# Patient Record
Sex: Female | Born: 1935 | Race: White | Hispanic: No | Marital: Married
Health system: Southern US, Community
[De-identification: ages and names within clinical notes are randomized; demographics above are authoritative.]

---

## 1998-02-27 ENCOUNTER — Other Ambulatory Visit: Admission: RE | Admit: 1998-02-27 | Discharge: 1998-02-27 | Payer: Self-pay | Admitting: Orthopedic Surgery

## 1998-03-01 ENCOUNTER — Other Ambulatory Visit: Admission: RE | Admit: 1998-03-01 | Discharge: 1998-03-01 | Payer: Self-pay | Admitting: Orthopedic Surgery

## 1998-03-07 ENCOUNTER — Ambulatory Visit (HOSPITAL_BASED_OUTPATIENT_CLINIC_OR_DEPARTMENT_OTHER): Admission: RE | Admit: 1998-03-07 | Discharge: 1998-03-07 | Payer: Self-pay | Admitting: Orthopedic Surgery

## 1998-04-18 ENCOUNTER — Other Ambulatory Visit: Admission: RE | Admit: 1998-04-18 | Discharge: 1998-04-18 | Payer: Self-pay | Admitting: Orthopedic Surgery

## 1998-04-20 ENCOUNTER — Inpatient Hospital Stay (HOSPITAL_COMMUNITY): Admission: RE | Admit: 1998-04-20 | Discharge: 1998-04-23 | Payer: Self-pay | Admitting: Orthopedic Surgery

## 1999-09-18 ENCOUNTER — Ambulatory Visit (HOSPITAL_COMMUNITY): Admission: RE | Admit: 1999-09-18 | Discharge: 1999-09-18 | Payer: Self-pay | Admitting: Orthopedic Surgery

## 2000-01-30 ENCOUNTER — Encounter: Admission: RE | Admit: 2000-01-30 | Discharge: 2000-01-30 | Payer: Self-pay | Admitting: Internal Medicine

## 2001-04-28 ENCOUNTER — Inpatient Hospital Stay (HOSPITAL_COMMUNITY): Admission: AD | Admit: 2001-04-28 | Discharge: 2001-05-19 | Payer: Self-pay | Admitting: Orthopaedic Surgery

## 2001-04-28 ENCOUNTER — Encounter: Payer: Self-pay | Admitting: Orthopaedic Surgery

## 2001-05-12 ENCOUNTER — Encounter: Payer: Self-pay | Admitting: Physical Medicine & Rehabilitation

## 2001-05-13 ENCOUNTER — Encounter: Payer: Self-pay | Admitting: Orthopedic Surgery

## 2001-05-14 ENCOUNTER — Encounter: Payer: Self-pay | Admitting: Orthopedic Surgery

## 2002-08-29 ENCOUNTER — Inpatient Hospital Stay (HOSPITAL_COMMUNITY): Admission: AD | Admit: 2002-08-29 | Discharge: 2002-09-16 | Payer: Self-pay | Admitting: Internal Medicine

## 2002-08-31 ENCOUNTER — Encounter: Payer: Self-pay | Admitting: Cardiology

## 2002-09-02 ENCOUNTER — Encounter: Payer: Self-pay | Admitting: Internal Medicine

## 2002-09-15 ENCOUNTER — Encounter: Payer: Self-pay | Admitting: Gastroenterology

## 2003-02-11 ENCOUNTER — Encounter: Payer: Self-pay | Admitting: Orthopedic Surgery

## 2003-02-11 ENCOUNTER — Ambulatory Visit (HOSPITAL_COMMUNITY): Admission: RE | Admit: 2003-02-11 | Discharge: 2003-02-11 | Payer: Self-pay | Admitting: Orthopedic Surgery

## 2003-02-17 ENCOUNTER — Encounter: Payer: Self-pay | Admitting: Orthopedic Surgery

## 2003-02-17 ENCOUNTER — Ambulatory Visit (HOSPITAL_COMMUNITY): Admission: RE | Admit: 2003-02-17 | Discharge: 2003-02-17 | Payer: Self-pay | Admitting: Orthopedic Surgery

## 2003-02-21 ENCOUNTER — Encounter (INDEPENDENT_AMBULATORY_CARE_PROVIDER_SITE_OTHER): Payer: Self-pay | Admitting: Specialist

## 2003-02-21 ENCOUNTER — Ambulatory Visit (HOSPITAL_COMMUNITY): Admission: RE | Admit: 2003-02-21 | Discharge: 2003-02-22 | Payer: Self-pay | Admitting: Orthopedic Surgery

## 2003-02-21 ENCOUNTER — Encounter: Payer: Self-pay | Admitting: Orthopedic Surgery

## 2005-01-06 ENCOUNTER — Ambulatory Visit: Payer: Self-pay | Admitting: Internal Medicine

## 2005-01-06 ENCOUNTER — Encounter: Payer: Self-pay | Admitting: Cardiology

## 2005-01-06 ENCOUNTER — Inpatient Hospital Stay (HOSPITAL_COMMUNITY): Admission: EM | Admit: 2005-01-06 | Discharge: 2005-01-15 | Payer: Self-pay | Admitting: Internal Medicine

## 2005-01-06 ENCOUNTER — Ambulatory Visit: Payer: Self-pay | Admitting: Cardiology

## 2005-01-11 ENCOUNTER — Ambulatory Visit: Payer: Self-pay | Admitting: Pulmonary Disease

## 2005-04-12 ENCOUNTER — Emergency Department (HOSPITAL_COMMUNITY): Admission: EM | Admit: 2005-04-12 | Discharge: 2005-04-12 | Payer: Self-pay | Admitting: Emergency Medicine

## 2005-09-09 ENCOUNTER — Ambulatory Visit: Payer: Self-pay | Admitting: Internal Medicine

## 2005-09-16 ENCOUNTER — Ambulatory Visit: Payer: Self-pay | Admitting: Internal Medicine

## 2005-10-14 ENCOUNTER — Ambulatory Visit: Payer: Self-pay | Admitting: Internal Medicine

## 2005-10-31 ENCOUNTER — Ambulatory Visit: Payer: Self-pay | Admitting: Cardiology

## 2005-11-11 ENCOUNTER — Ambulatory Visit: Payer: Self-pay | Admitting: Pulmonary Disease

## 2005-12-02 ENCOUNTER — Ambulatory Visit: Payer: Self-pay | Admitting: *Deleted

## 2005-12-23 ENCOUNTER — Ambulatory Visit: Payer: Self-pay | Admitting: Internal Medicine

## 2006-02-26 ENCOUNTER — Ambulatory Visit: Payer: Self-pay | Admitting: Cardiology

## 2006-03-05 ENCOUNTER — Emergency Department (HOSPITAL_COMMUNITY): Admission: EM | Admit: 2006-03-05 | Discharge: 2006-03-05 | Payer: Self-pay | Admitting: Emergency Medicine

## 2006-03-19 ENCOUNTER — Inpatient Hospital Stay (HOSPITAL_COMMUNITY): Admission: EM | Admit: 2006-03-19 | Discharge: 2006-03-26 | Payer: Self-pay | Admitting: Emergency Medicine

## 2006-03-19 ENCOUNTER — Ambulatory Visit: Payer: Self-pay | Admitting: Internal Medicine

## 2006-03-19 ENCOUNTER — Encounter: Payer: Self-pay | Admitting: Vascular Surgery

## 2006-04-02 ENCOUNTER — Inpatient Hospital Stay (HOSPITAL_COMMUNITY): Admission: EM | Admit: 2006-04-02 | Discharge: 2006-04-09 | Payer: Self-pay | Admitting: Emergency Medicine

## 2006-09-20 IMAGING — CT CT HEAD W/O CM
1 series · 16 of 30 positions shown, 20 images · IV contrast (agent unspecified)
Comparison: None.

CLINICAL DATA: Bilateral leg generalized weakness.  
 HEAD CT WITHOUT CONTRAST:
TECHNIQUE: Contiguous axial images were obtained from the base of the skull through the vertex, according to standard protocol, without contrast.

[Series 2: head_seq 4.5 h45s st · axial · 0.43mm/px · z∈[-173,-11]mm · 16 of 40 slices shown, 20 images]
[im 2/40  brain]
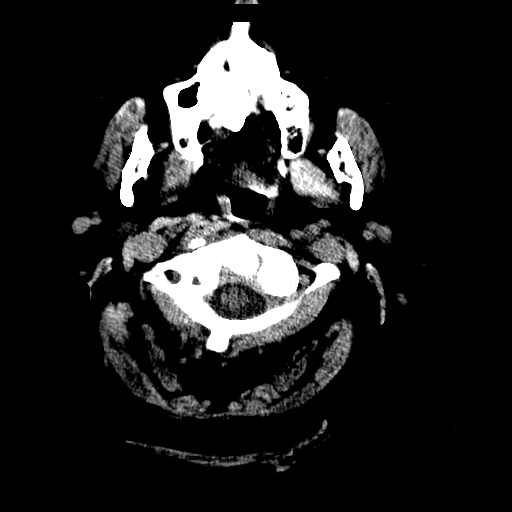
[im 2/40  bone]
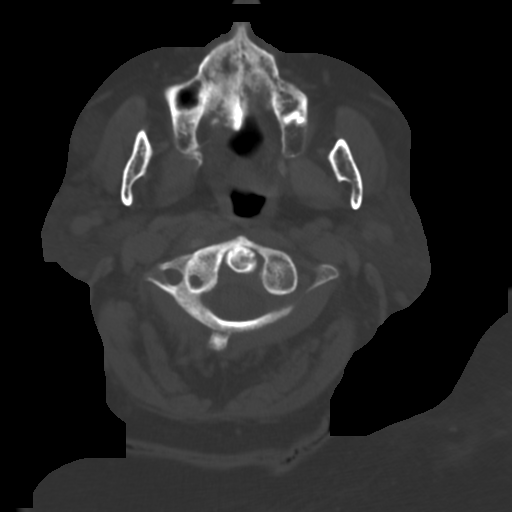
[im 5/40  brain]
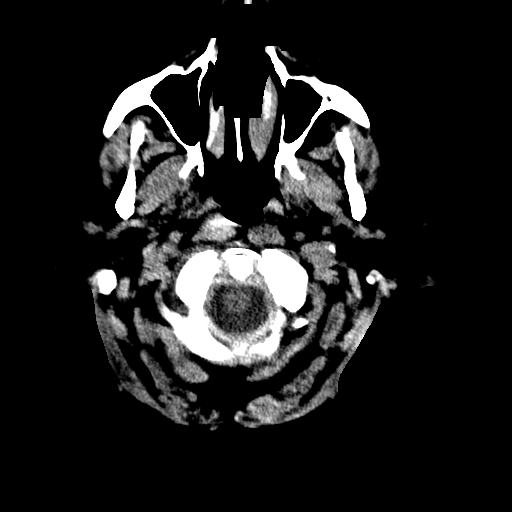
[im 7/40  brain]
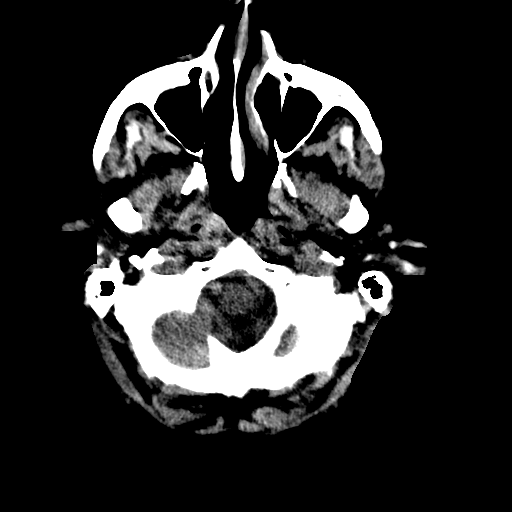
[im 10/40  brain]
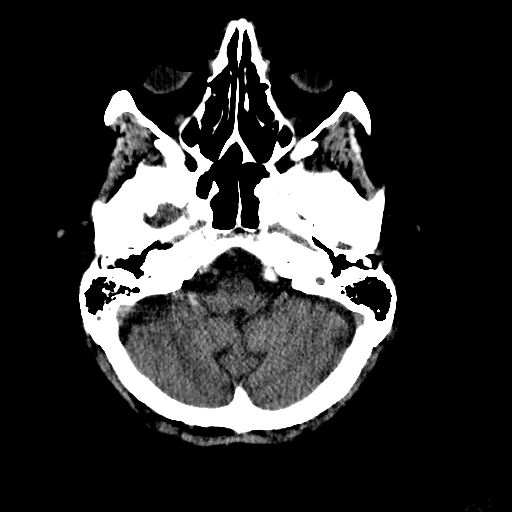
[im 11/40  brain]
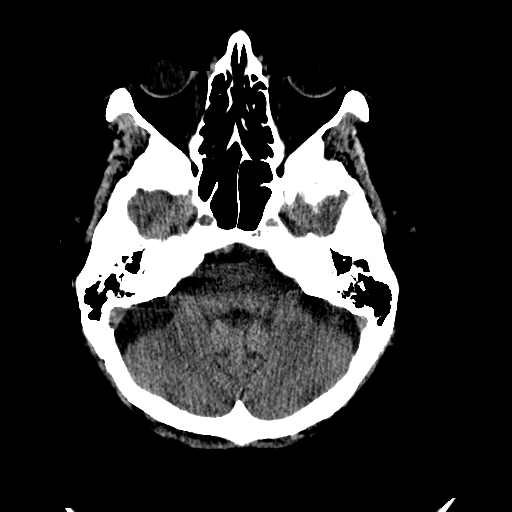
[im 11/40  bone]
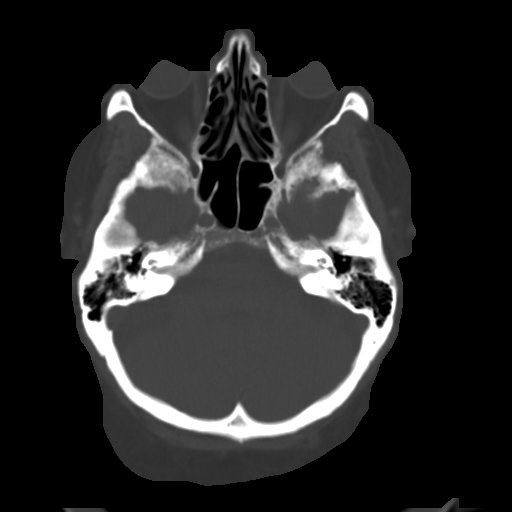
[im 14/40  brain]
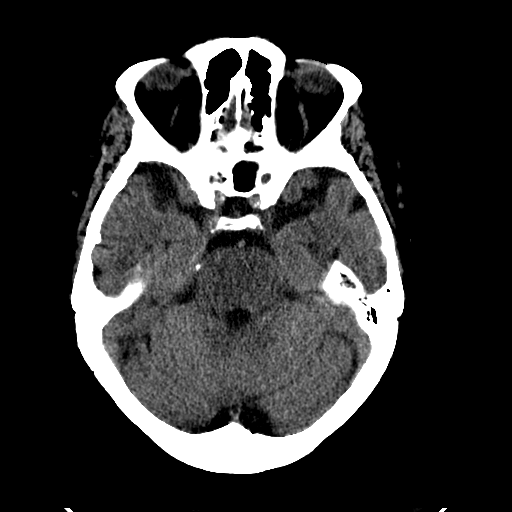
[im 17/40  brain]
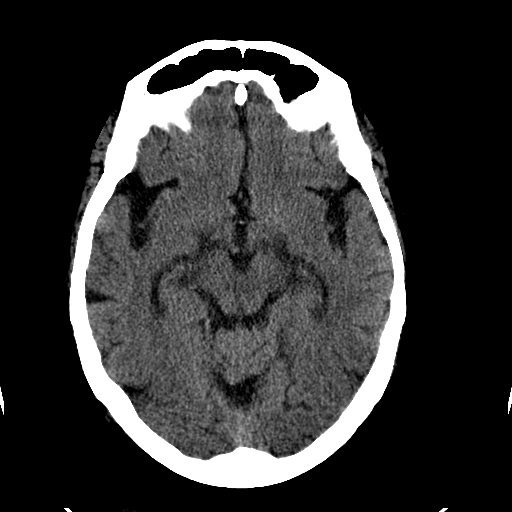
[im 19/40  brain]
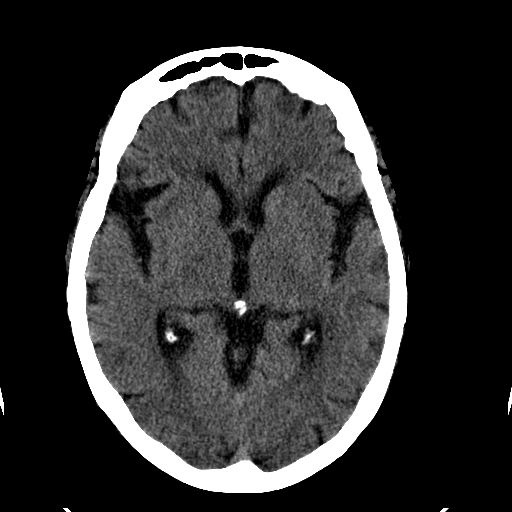
[im 21/40  brain]
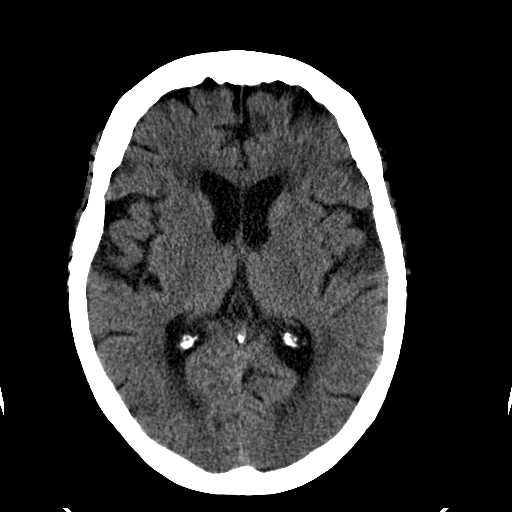
[im 21/40  bone]
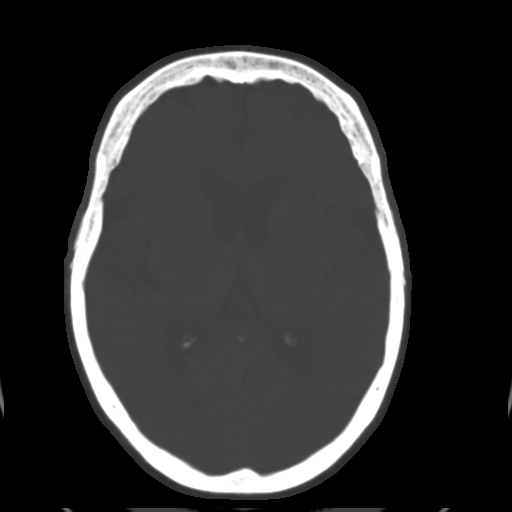
[im 23/40  brain]
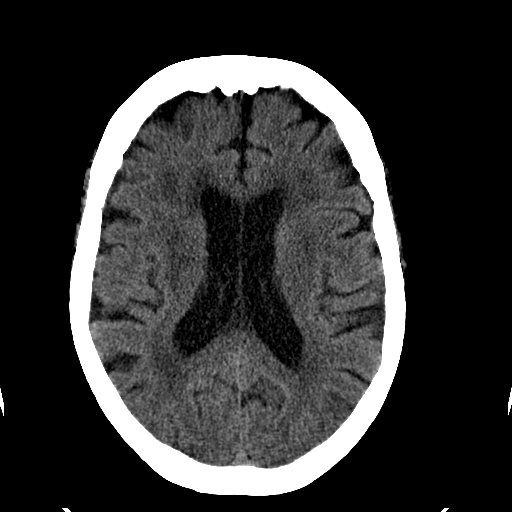
[im 26/40  brain]
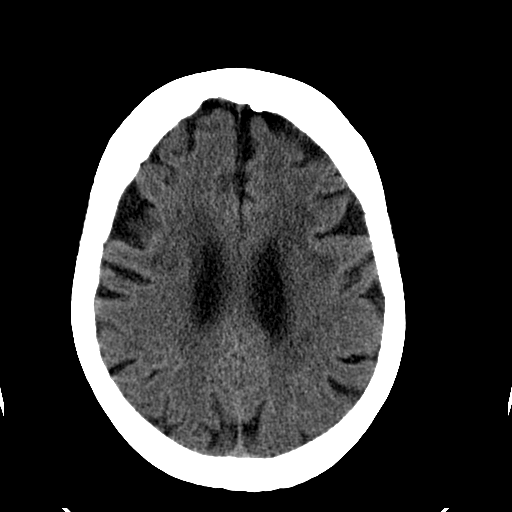
[im 29/40  brain]
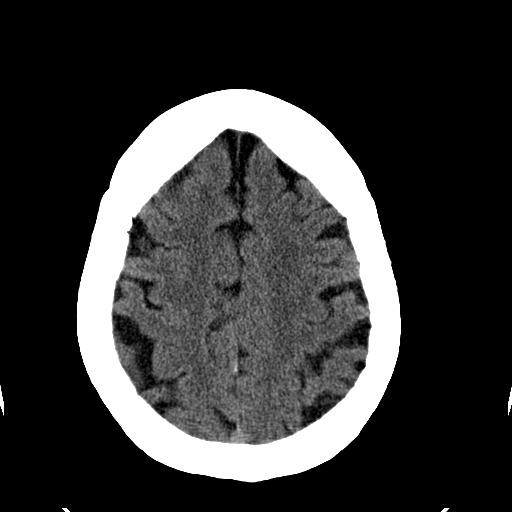
[im 30/40  brain]
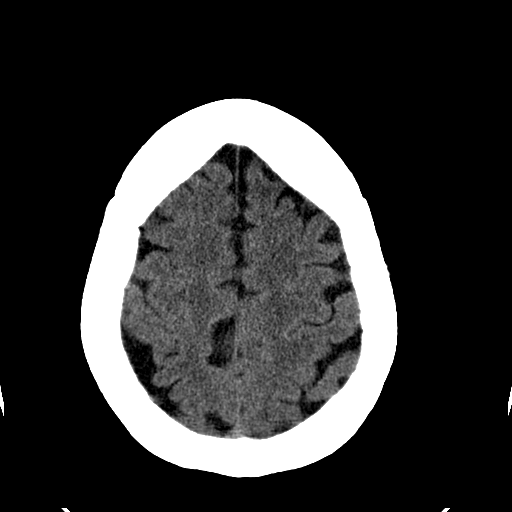
[im 30/40  bone]
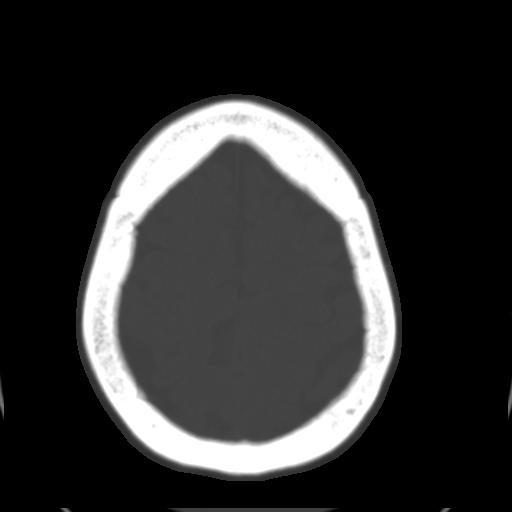
[im 33/40  brain]
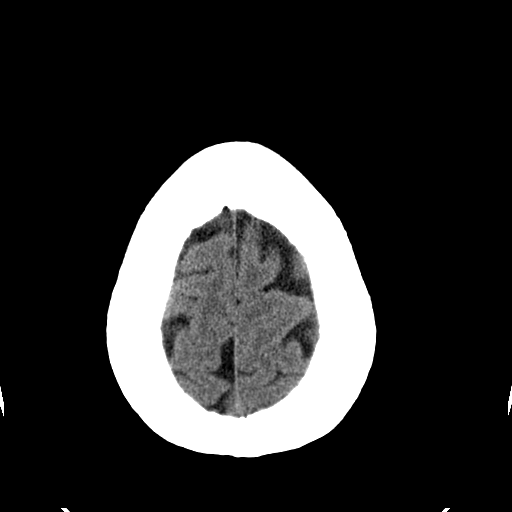
[im 35/40  brain]
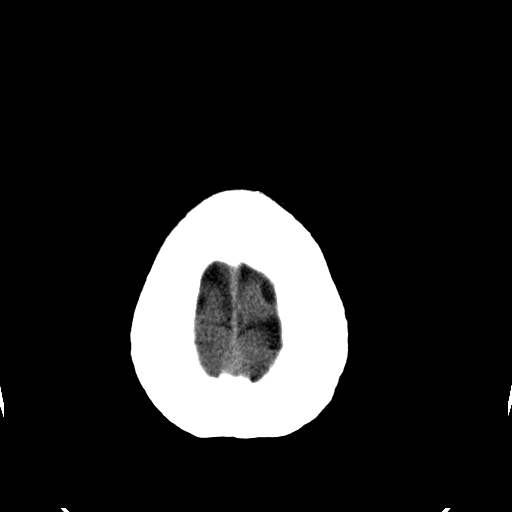
[im 38/40  brain]
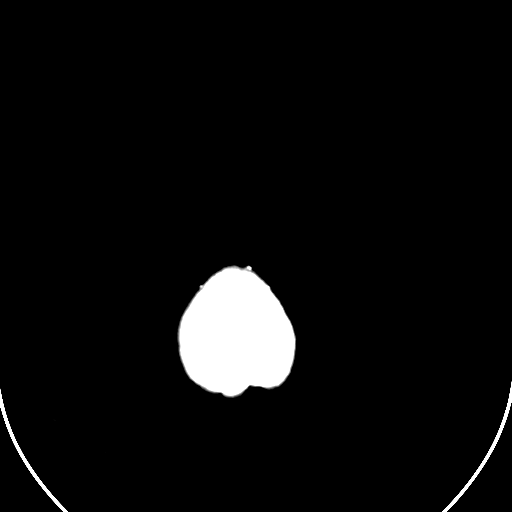

[16 of 30 positions shown; findings below may reference images not displayed]

FINDINGS: A series of scans through the entire head show mild diffuse generalized atrophy.  There is also symmetrical small vessel ischemia.  No focal abnormalities are seen within the brain.  There is no acute change seen.  The ventricular system is within the normal limit for this degree of atrophy.  
 Bone windows show the bony calvarium to be intact.  The internal auditory canals, mastoids, base of the skull and paranasal sinuses are within normal limits.
IMPRESSION: 1.  No acute intracranial findings.  Mild diffuse atrophy and small vessel ischemic disease.  No acute bony disease.  Paranasal sinuses appear normal.  
 2.  It should be noted that early ischemic infarcts may not be seen on CT scan.

## 2006-09-20 IMAGING — CR DG FEMUR 2+V*R*
4 series · 4 of 4 positions shown · non-contrast
Comparison: Knee dated 03/05/06.

CLINICAL DATA: knee pain
RIGHT FEMUR ? 2 VIEW:

[t femur with hip  ap right]
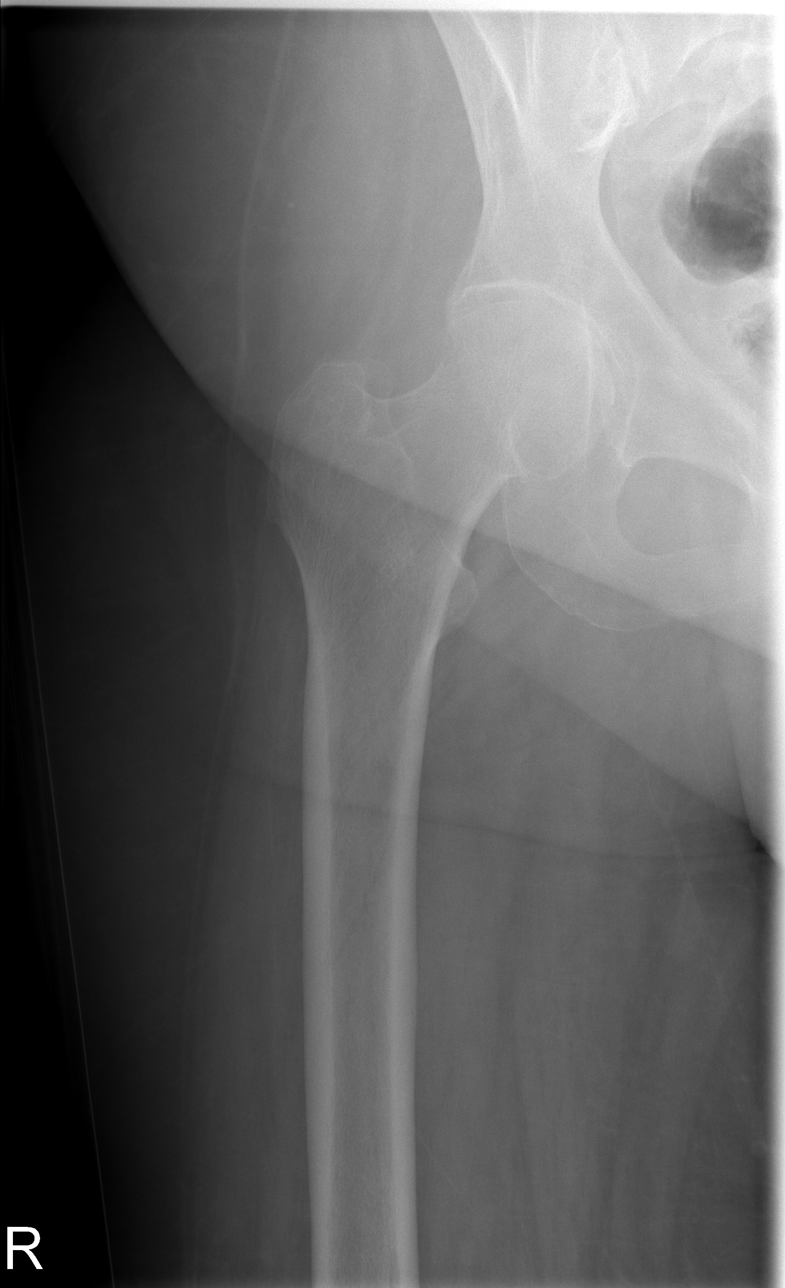

[t femur with knee ap right]
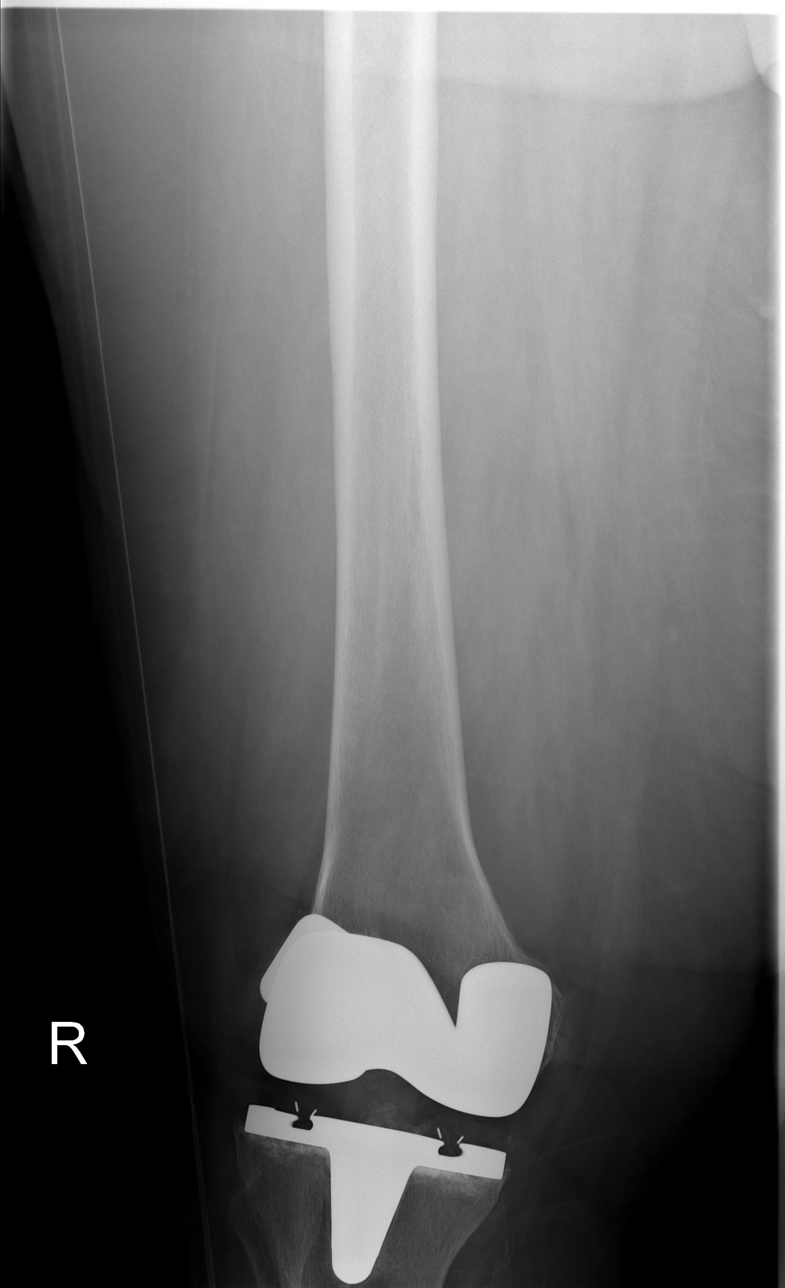

[w hip lateral right *]
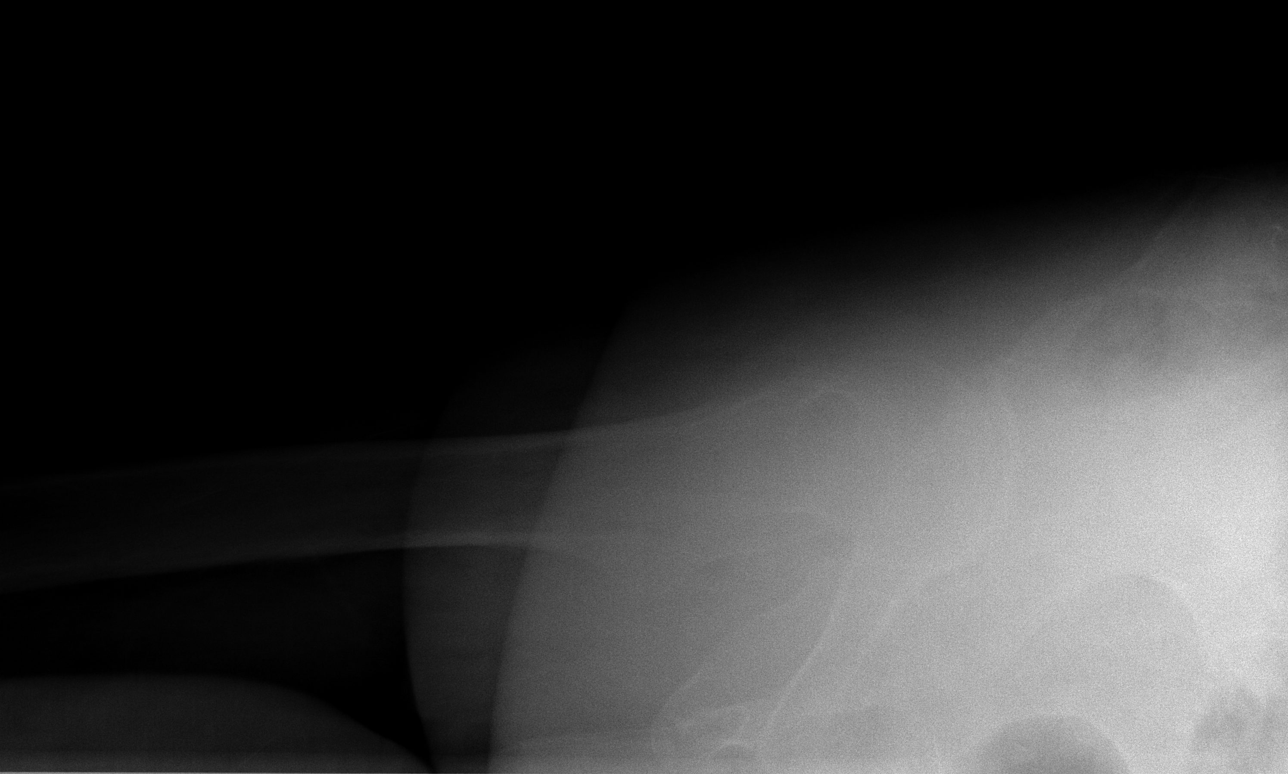

[view not recorded]
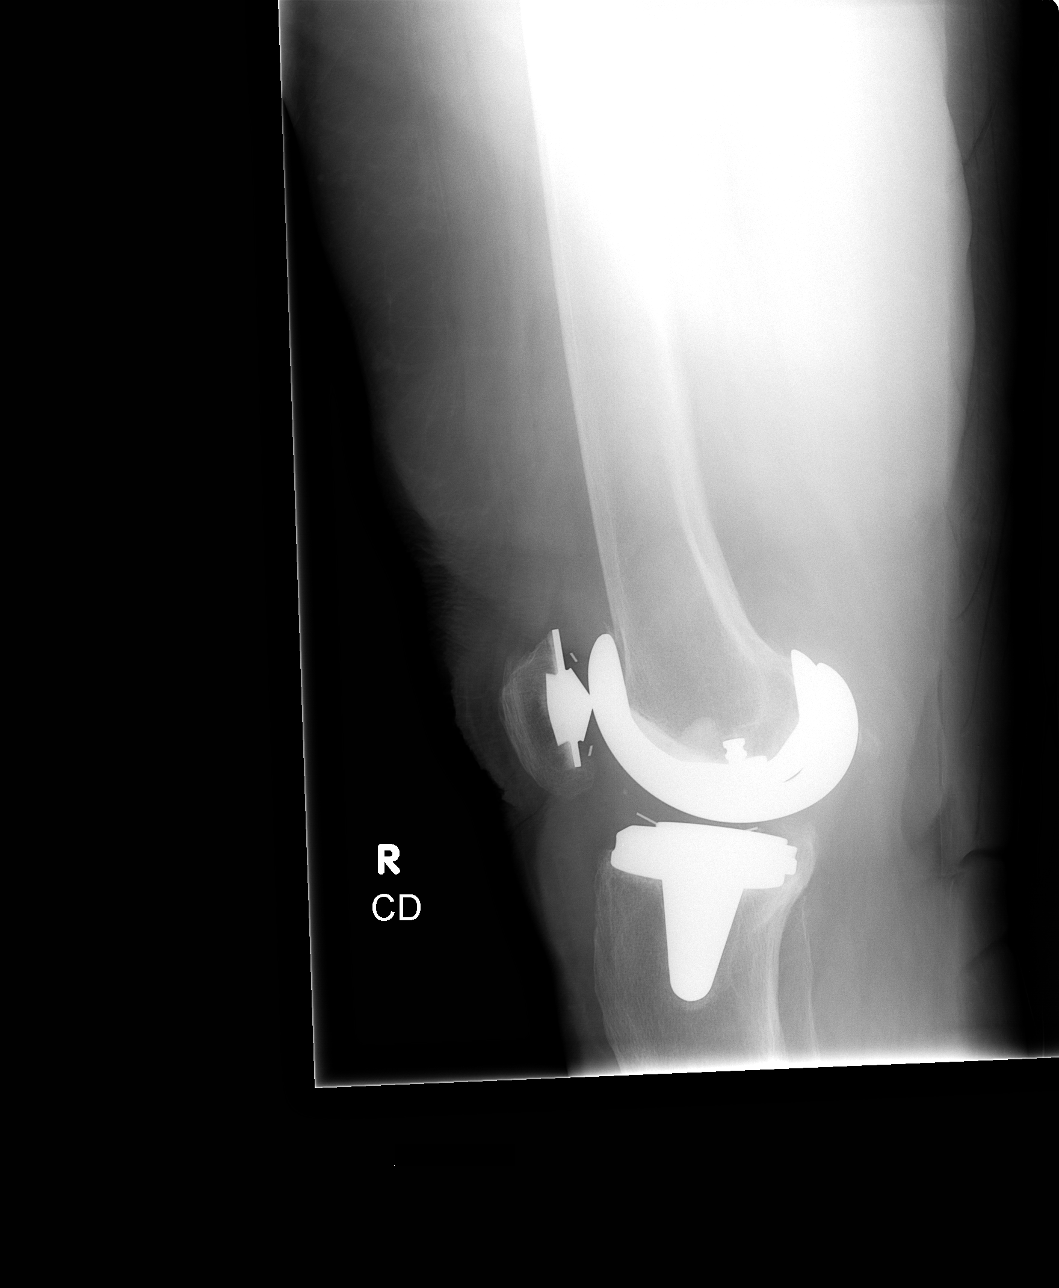

[4 of 4 positions shown; findings below may reference images not displayed]

FINDINGS: A right total knee arthroplasty device is in place, stable from the prior exam.  There are no fractures or dislocations noted. Mild degenerative changes affect the hip joint.
IMPRESSION: 1.  No acute findings.
2.  Mild hip osteoarthritis.

## 2009-04-11 ENCOUNTER — Encounter: Admission: RE | Admit: 2009-04-11 | Discharge: 2009-04-11 | Payer: Self-pay | Admitting: Family Medicine

## 2011-02-21 NOTE — Discharge Summary (Signed)
[name], [name]                 ACCOUNT NO.:  [id number]   MEDICAL RECORD NO.:  [id number]          PATIENT TYPE:  INP   LOCATION:  6704                         FACILITY:  [facility]   PHYSICIAN:  Casimiro Needle B. Sherene Sires, MD, FCCPDATE OF BIRTH:  1926/01/07   DATE OF ADMISSION:  04/02/2006  DATE OF DISCHARGE:  04/09/2006                                 DISCHARGE SUMMARY   DISCHARGE DIAGNOSES:  1. Vent-dependent respiratory failure with underlying obesity      hyperventilation and probable obstructive sleep apnea.  2. Hypervolemic hemorrhagic shock.  3. Anemia secondary to acute blood loss.  4. Enterococcus urinary tract infection.  5. Atrial fibrillation.   HISTORY OF PRESENT ILLNESS:  [name] is a 77 year old female who  presented severe anemia in a hypercoagulable state with an INR of 7.1 with  prior Coumadin use.  She was in shock and no respiratory distress.  She is  admitted with a history of hypertension, chronic atrial fibrillation, prior  cellulitis, obesity.  Admitted from a skilled nursing facility with  hypertension and severe anemia and a hypercoagulable state secondary to  Coumadin use.  There was a question of retroperitoneal bleed, but CT scan  confirmed a hematoma in the buttocks.   LABORATORY DATA:  WBCs 16.3, hemoglobin 7.5 with a peak of 8.7, hematocrit  21.1 with a peak of 26.9, platelets of 362, INR with a peak of 7.1 with 1.3  by discharge, sodium 140, potassium 4.0, chloride 101, CO2 was 32, glucose  121, BUN 18, creatinine 0.7, calcium is 8.5.  Hemoglobin A1c was 6.0.  Digoxin level is 0.5.  Blood cultures show no growth.  Urine cultures showed  Enterococcus species and E. Coli.  Troponin-I was less than 0.05.  Chest x-  ray shows stable support apparatus with improved aeration on the right.  Abdominal  CT shows acute hematomas of the left buttock, posterior aspect of  the left thigh.  She had a left subclavian catheter placement at tip of the  costal SVC,  since removed.   A 12-lead EKG shows sinus tachycardia with AV disassociation at a rate of  122.   HOSPITAL COURSE/DISCHARGE DIAGNOSES:  1. Vent-dependent respiratory failure or known basic mechanical      ventilatory support, secondary to obesity hyperventilation and probable      obstructive sleep apnea, along with anemia and altered mental status.      This situation revolved after appropriate interventions for her blood      loss anemia and shock.  She was placed on supplemental oxygen and      increased her activity as tolerated.  2. Hypervolemic hemorrhagic shock secondary to enlarged left buttock      hematoma in the setting of super-therapeutic INR.  Her INR is noted to      be as high as 7.1.  Her Coumadin was held.  She was transfused for a      total of 6 units of packed red blood cells.  3. Anemia of acute blood loss.  Again, her hemoglobin came up to 8.7 with  a transfusion of 6 units.  She was left off of her Coumadin.  4. Enterococcus urinary tract infection.  She was treated with Rocephin as      anti-microbial therapy, and this was completed.  5. Atrial fibrillation with rate control.  The Coumadin was held.  She had      adequate control by the time of discharge to a skilled nursing      facility.   DIET:  As per instructions.   DISCHARGE MEDICATIONS:  Not available to me at the time of this dictation,  since she was transferred directly to a nursing home.   CONDITION ON DISCHARGE:  Improved.     ______________________________  Devra Dopp, MSN, ACNP    ______________________________  Charlaine Dalton. Sherene Sires, MD, FCCP    SM/MEDQ  D:  06/02/2006  T:  06/02/2006  Job:  [id number]

## 2011-02-21 NOTE — Consult Note (Signed)
NAME:  AMILLIANA, HAYWORTH                           ACCOUNT NO.:  [id number]   MEDICAL RECORD NO.:  [id number]                   PATIENT TYPE:  INP   LOCATION:  3710                                 FACILITY:  [facility]   PHYSICIAN:  Salvadore Farber, M.D. [facility]         DATE OF BIRTH:  [birth year]   DATE OF CONSULTATION:  08/30/2002  DATE OF DISCHARGE:                                   CONSULTATION   CHIEF COMPLAINT:  New onset atrial fibrillation.   HISTORY OF PRESENT ILLNESS:  We are asked by Dr. Sherene Sires to provide a  consultation in regards to this 77 year old lady who presented to Dr. Thurston Hole  office yesterday with two days of increasing dyspnea with minimal activity.  She denied chest pain, cough, palpitations, leg edema, recent travel, fever,  nausea and vomiting. She did note mild orthopnea and PND. Dr. Thurston Hole  examination revealed atrial fibrillation  at a rate of 143 beats per minute.  She was admitted to the hospital, anticoagulated and rate controlled with  initially IV and subsequently oral diltiazem.   PAST MEDICAL HISTORY:  1. Congestive heart failure with normal left ventricular systolic function     reported on March 2002 echocardiogram.  2. Aortic insufficiency, details currently not available; I am working to     obtain these.  3. Hypertension.  4. Morbid obesity.  5. Chronic venous insufficiency.  6. Degenerative joint disease.  7. Status post tonsillectomy  and adenoidectomy.  8. Status post appendectomy.  9. Status post bilateral knee replacements in 1995.  10.      Bladder dysfunction.  11.      Gastroesophageal reflux disease.   ALLERGIES:  1. PENICILLIN.  2. NOVOCAINE.   MEDICATIONS:  1. Labetalol 200 mg b.i.d. d  2. Prozac 20 mg b.i.d.  3. Detrol 2 mg  b.i.d.  4. Aspirin 325 mg q.d.  5. Aldactazide 50/50 q.d.  6. Hyzaar 100/25 q.d.  7. Multivitamin q.d.   SOCIAL HISTORY:  She is widowed with three children and lives alone in  [place]. She is a retired  Catering manager. She denies alcohol and  tobacco  use.   FAMILY HISTORY:  Remarkable for the lack of premature atherosclerotic  disease.   REVIEW OF SYSTEMS:  Remarkable for pain in her knees with minimal  ambulation. It is otherwise negative in detail except as above.   PHYSICAL EXAMINATION:  GENERAL:  She is  a morbidly obese, chronically ill  appearing woman in no distress, weight 335.  VITAL SIGNS:  Heart rate 85, blood pressure 100/75, respiratory rate 16, O2  saturation 99% on 2 liters.  NECK:  Jugular veins were not visible due to habitus. Carotid pulses 2+  bilaterally without bruits.  LUNGS:  Clear to auscultation bilaterally.  CARDIAC:  She has a nonpalpable point of maximal cardiac  impulse. She has  an irregularly irregular rhythm with no murmur, S3 or S4 audible.  ABDOMEN:  Obese, soft, nontender, nondistended. There is no  hepatosplenomegaly. Bowel sounds are normal.  EXTREMITIES:  Warm with trace edema bilaterally.   LABORATORY DATA:  An electrocardiogram demonstrates atrial fibrillation with  ventricular rate 102 beats per minute. It is otherwise normal.   Laboratory studies are remarkable for hematocrit 41, platelets 243. Sodium  138, potassium 3.9, creatinine 1.0.  Normal transaminases and alkaline  phosphatase. Troponin 0.03. BNP 135. INR 1.1, PTT 73.   IMPRESSION:  This is a 77 year old lady with new onset atrial fibrillation.  Currently she is well rate controlled on oral diltiazem and her usual  Labetalol. She is currently anticoagulated with heparin.   PLAN:  With her prior history of diastolic dysfunction, I think it is  unlikely that she will well tolerate the atrial fibrillation over the long  haul, even with adequate rate  control. Therefore I recommended a plan for a  trial of maintenance of normal sinus rhythm with TEE cardioversion to be  scheduled for tomorrow. I think her risks of recurrence of atrial  fibrillation is relatively high. Therefore we plan  on continuing her on  Coumadin indefinitely, even if normal sinus rhythm is established.   I  am unable to hear a murmur of aortic insufficiency. Will await the  echocardiogram which is currently pending to assess its severity and  possible contribution.                                                 Salvadore Farber, M.D. [facility]    WED/MEDQ  D:  08/30/2002  T:  08/30/2002  Job:  [id number]   cc:   Charlaine Dalton. Sherene Sires, M.D. [facility]  [address]  [place]  [place] [address]  Fax: [phone]

## 2011-02-21 NOTE — Cardiovascular Report (Signed)
[name], [name]                 ACCOUNT NO.:  [id number]   MEDICAL RECORD NO.:  [id number]          PATIENT TYPE:  INP   LOCATION:  6704                         FACILITY:  [facility]   PHYSICIAN:  Casimiro Needle B. Sherene Sires, M.D. [facility] OF BIRTH:  20-May-1926   DATE OF PROCEDURE:  DATE OF DISCHARGE:  04/09/2006                              CARDIAC CATHETERIZATION   FINAL DIAGNOSES:  1.  Acute circulatory shock, secondary to blood-loss anemia from      hypocoagulability state.      1.  Extensive soft-tissue bleeding into the left buttock with large          hematoma.      2.  All anticoagulation held at this point.  Once she is clinically          stable, the plan is to place her back on Lovenox subcu, but not on          Coumadin.  2.  Chronic atrial fibrillation, maintained on Coumadin chronically.  3.  Morbid obesity, complicated by severe degenerative arthritis.  4.  Hypertension.  5.  Urinary tract infection with enterococcus, sensitive to Macrodantin,      initiated on 06/30 for the plan of 7 days of therapy.  6.  Chronic O2 dependent respiratory failure, secondary to the effects of      obesity.   HISTORY:  This is a 77 year old white female with morbid obesity, chronic  atrial fibrillation and severe chronic venous stasis dermatitis with high-  risk for DVT and pulmonary embolism; therefore, maintained on Coumadin.  At  the time of discharge from [facility], she was not yet therapeutic  on Coumadin and the plan was to continue on Lovenox until she had adequate  INR and then discontinue the Lovenox and titrate the Coumadin to an INR  between 2 and 3.5.  However, she abruptly became hypotensive on June 28 and  was admitted with a large left buttock hematoma with excessive INR and  required volume resuscitation with blood products and BiPAP for respiratory  distress.  The family had decided, at that point, to make her a no code blue  and place her back in a nursing home  permanently.   We treated her conservatively with blood products and her hematocrit  stabilized at around 25%, but did not start her back on any anticoagulants  at this time, feeling that it was too soon.   She was treated for UTI with Macrodantin, as outlined in the problem list,  above.   At the time of discharge, it is felt that her condition is improved to the  point where she is able to take p.o., but is totally immobilized, due to  obesity, and will require PT/OT in the nursing home, as well as restriction  of caloric intake to about a 1600 calorie 4 g sodium, low carbohydrate diet,  O2 at 2 L per minute and the following medications.   1.  Aricept 10 mg q.h.s.  2.  Lanoxin 0.125 mg q.a.m.  (Last Dig. level obtained was 0.5 on April 07, 2006, and therefore, felt to represent steady state.)  3.  Lasix 20 mg daily.  4.  Lexapro 10 mg daily.  5.  Lopressor 50 mg twice daily.  6.  Macrodantin 100 mg q.i.d. to be discontinued on July 6.  7.  Multivitamins one capsule daily.  8.  Protonix 40 mg daily.  9.  Tylenol 325 two q.4 p.r.n.   There are no other lab studies that need followup at this point and she is a  full no code blue, per her and her family's request, with the prognosis for  functional recovery poor at this point, based on chronic severe morbid  obesity and debilitation on this basis, as well as general geriatric failure  to thrive.           ______________________________  Charlaine Dalton. Sherene Sires, M.D. [facility]     MBW/MEDQ  D:  04/08/2006  T:  04/08/2006  Job:  [id number]

## 2011-02-21 NOTE — Op Note (Signed)
[facility]  Patient:    [name], [name]                        MRN: [id number] Proc. Date: 04/28/01 Adm. Date:  [id number] Attending:  Alinda Deem                           Operative Report  PREOPERATIVE DIAGNOSIS:  Fractured lateral meniscal bearing of a Depuy LCS right total knee.  POSTOPERATIVE DIAGNOSIS:  Fractured lateral meniscal bearing of a Depuy LCS right total knee.  Poly wear to the medial bearing as well as the patellar bearing.  OPERATION PERFORMED:  Revision right total knee with removal of the fractured and damaged meniscal bearings as well as the patellar component.  SURGEON:  Alinda Deem, M.D.  ASSISTANT:  Dorthula Matas, P.A.-C.  ANESTHESIA:  General endotracheal.  ESTIMATED BLOOD LOSS:  500 cc.  DRAINS:  Two medium Hemovacs.  TOURNIQUET TIME:  33 minutes.  FLUID REPLACEMENT:  200 cc crystalloid.  Two units packed red blood cells.  URINE OUTPUT:  650 cc.  INDICATIONS FOR PROCEDURE:  The patient is a 77 year old woman who underwent bilateral total knee arthroplasties back in the 1994-95 time frame, had fracture of the lateral bearing of the right total knee a couple of weeks ago. She developed a cellulitis in that leg.  A few days later was admitted to the hospital and given intravenous antibiotics and ultimately Infectious Disease has felt that her leg inflammation was more related to her venous stasis than any evidence of infection.  She is taken for revision of her right total knee and accepts the risks and benefits of surgery.  DESCRIPTION OF PROCEDURE:  The patient was identified by arm band and taken to the operating room at [facility] where the appropriate anesthetic monitors were attached and general endotracheal anesthesia induced with the patient in the supine position.  She was given a 500 mg dose of IV vancomycin and a tourniquet was applied high to the right lower extremity.   The right lower extremity was then prepped and draped in the usual sterile fashion from the foot to the tourniquet. The limb was wrapped with an Esmarch bandage, tourniquet inflated to 400 mmHg because of her 400+ pound body habitus and we began the procedure by make an anterior midline incision utilizing the old anterior midline incision from eight years ago.  Bleeders in the skin and subcutaneous tissue were identified and cauterized.  Medial parapatellar arthrotomy was accomplished and we immediately removed the anterior one half of the lateral meniscal bearing.  The posterior half of the bearing was found in the notch and was removed without difficulty as well.  The medial bearing is also extracted after applying traction to the knee with the lamina spreader.  This bearing was fractured half way through and there was definite poly wear to the patellar button.  The poly component of the patellar button was then removed.  The synovium was quite inflamed and there were bits of plastic noted in the synovium so a fairly extensive synovectomy was accomplished anteromedial, lateral and through the notch.  The wound was then carefully washed out with normal saline solution and with the lamina spreader in place, a new set of 15 mm  large bearings was implanted into the total knee.  Excellent tracking was noted.  Ligamentous laxity was noted to  be trace positive to valgus testing only.  A new patellar button was also inserted and the tourniquet was let down.  Small bleeders were identified and cauterized, medium Hemovacs were placed deep in the wound.  At this point the parapatellar arthrotomy closed with running #1 Vicryl suture, the subcutaneous tissue with 0 and 2-0 undyed Vicryl sutures.  Skin was closed with staples.  A dressing of Xeroform, 4 x 4 dressing sponges, Webril and an Ace wrap applied.  The patient was awakened and taken to the recovery room without difficulty. DD:  05/13/01 TD:   05/13/01 Job: [id number]

## 2011-02-21 NOTE — H&P (Signed)
[name], AHLBERG                 ACCOUNT NO.:  [id number]   MEDICAL RECORD NO.:  [id number]          PATIENT TYPE:  EMS   LOCATION:  MAJO                         FACILITY:  [facility]   PHYSICIAN:  Jonna L. Robb Matar, M.D.DATE OF BIRTH:  04-Nov-1925   DATE OF ADMISSION:  04/02/2006  DATE OF DISCHARGE:                                HISTORY & PHYSICAL   PRIMARY CARE PHYSICIAN:  Dr. Leanord Hawking.   CODE STATUS:  Full.   CHIEF COMPLAINT:  Lethargy.   HISTORY:  This 77 year old morbidly obese Caucasian female was admitted last  month with cellulitis that was treated with Avelox and Diflucan, and new  onset of atrial fibrillation for which she was started on Lovenox and  Coumadin.  The patient, yesterday, began to get very lethargic and weak   DICTATION ENDED AT THIS POINT.      Jonna L. Robb Matar, M.D.     Dorna Bloom  D:  04/02/2006  T:  04/02/2006  Job:  [id number]   cc:   Maxwell Caul, M.D.  Fax: [phone]

## 2013-06-02 ENCOUNTER — Telehealth: Payer: Self-pay | Admitting: Endocrinology

## 2013-06-02 NOTE — Telephone Encounter (Deleted)
Please call about disabiloy form from [employer]

## 2014-01-16 NOTE — Telephone Encounter (Signed)
error
